# Patient Record
Sex: Female | Born: 1990 | Race: White | Hispanic: No | Marital: Married | Smoking: Current every day smoker
Health system: Southern US, Community
[De-identification: ages and names within clinical notes are randomized; demographics above are authoritative.]

## PROBLEM LIST (undated history)

## (undated) DIAGNOSIS — F319 Bipolar disorder, unspecified: Secondary | ICD-10-CM

## (undated) DIAGNOSIS — J45909 Unspecified asthma, uncomplicated: Secondary | ICD-10-CM

---
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
## 2008-01-07 ENCOUNTER — Emergency Department (HOSPITAL_COMMUNITY): Admission: EM | Admit: 2008-01-07 | Discharge: 2008-01-07 | Payer: Self-pay | Admitting: Emergency Medicine

## 2008-01-23 ENCOUNTER — Emergency Department (HOSPITAL_COMMUNITY): Admission: EM | Admit: 2008-01-23 | Discharge: 2008-01-23 | Payer: Self-pay | Admitting: Emergency Medicine

## 2008-01-24 ENCOUNTER — Ambulatory Visit (HOSPITAL_COMMUNITY): Admission: EM | Admit: 2008-01-24 | Discharge: 2008-01-24 | Payer: Self-pay | Admitting: Emergency Medicine

## 2008-01-24 ENCOUNTER — Encounter: Payer: Self-pay | Admitting: Obstetrics and Gynecology

## 2008-03-02 ENCOUNTER — Emergency Department (HOSPITAL_COMMUNITY): Admission: EM | Admit: 2008-03-02 | Discharge: 2008-03-02 | Payer: Self-pay | Admitting: Emergency Medicine

## 2008-03-16 ENCOUNTER — Emergency Department (HOSPITAL_COMMUNITY): Admission: EM | Admit: 2008-03-16 | Discharge: 2008-03-16 | Payer: Self-pay | Admitting: Emergency Medicine

## 2008-04-04 ENCOUNTER — Inpatient Hospital Stay (HOSPITAL_COMMUNITY): Admission: EM | Admit: 2008-04-04 | Discharge: 2008-04-05 | Payer: Self-pay | Admitting: Emergency Medicine

## 2008-04-21 ENCOUNTER — Emergency Department (HOSPITAL_COMMUNITY): Admission: EM | Admit: 2008-04-21 | Discharge: 2008-04-21 | Payer: Self-pay | Admitting: Emergency Medicine

## 2008-05-26 ENCOUNTER — Emergency Department (HOSPITAL_COMMUNITY): Admission: EM | Admit: 2008-05-26 | Discharge: 2008-05-26 | Payer: Self-pay | Admitting: Emergency Medicine

## 2008-06-19 ENCOUNTER — Ambulatory Visit: Payer: Self-pay | Admitting: Cardiology

## 2008-06-23 ENCOUNTER — Ambulatory Visit: Payer: Self-pay | Admitting: Cardiology

## 2008-06-26 ENCOUNTER — Ambulatory Visit: Payer: Self-pay | Admitting: Internal Medicine

## 2008-06-26 ENCOUNTER — Ambulatory Visit (HOSPITAL_COMMUNITY): Admission: RE | Admit: 2008-06-26 | Discharge: 2008-06-26 | Payer: Self-pay | Admitting: Internal Medicine

## 2008-06-28 ENCOUNTER — Emergency Department (HOSPITAL_COMMUNITY): Admission: EM | Admit: 2008-06-28 | Discharge: 2008-06-28 | Payer: Self-pay | Admitting: Emergency Medicine

## 2008-08-24 ENCOUNTER — Emergency Department (HOSPITAL_COMMUNITY): Admission: EM | Admit: 2008-08-24 | Discharge: 2008-08-24 | Payer: Self-pay | Admitting: Emergency Medicine

## 2008-10-19 ENCOUNTER — Emergency Department (HOSPITAL_COMMUNITY): Admission: EM | Admit: 2008-10-19 | Discharge: 2008-10-19 | Payer: Self-pay | Admitting: Emergency Medicine

## 2009-02-02 ENCOUNTER — Emergency Department (HOSPITAL_COMMUNITY): Admission: EM | Admit: 2009-02-02 | Discharge: 2009-02-02 | Payer: Self-pay | Admitting: Emergency Medicine

## 2009-06-08 IMAGING — CR DG ABDOMEN ACUTE W/ 1V CHEST
3 series · 3 of 3 positions shown · non-contrast
Comparison: Chest radiograph 03/02/2008

CLINICAL DATA: Nausea, vomiting, diarrhea, question food poisoning

ACUTE ABDOMEN SERIES (ABDOMEN 2 VIEW & CHEST 1 VIEW)

[view not recorded (1 of 3)]
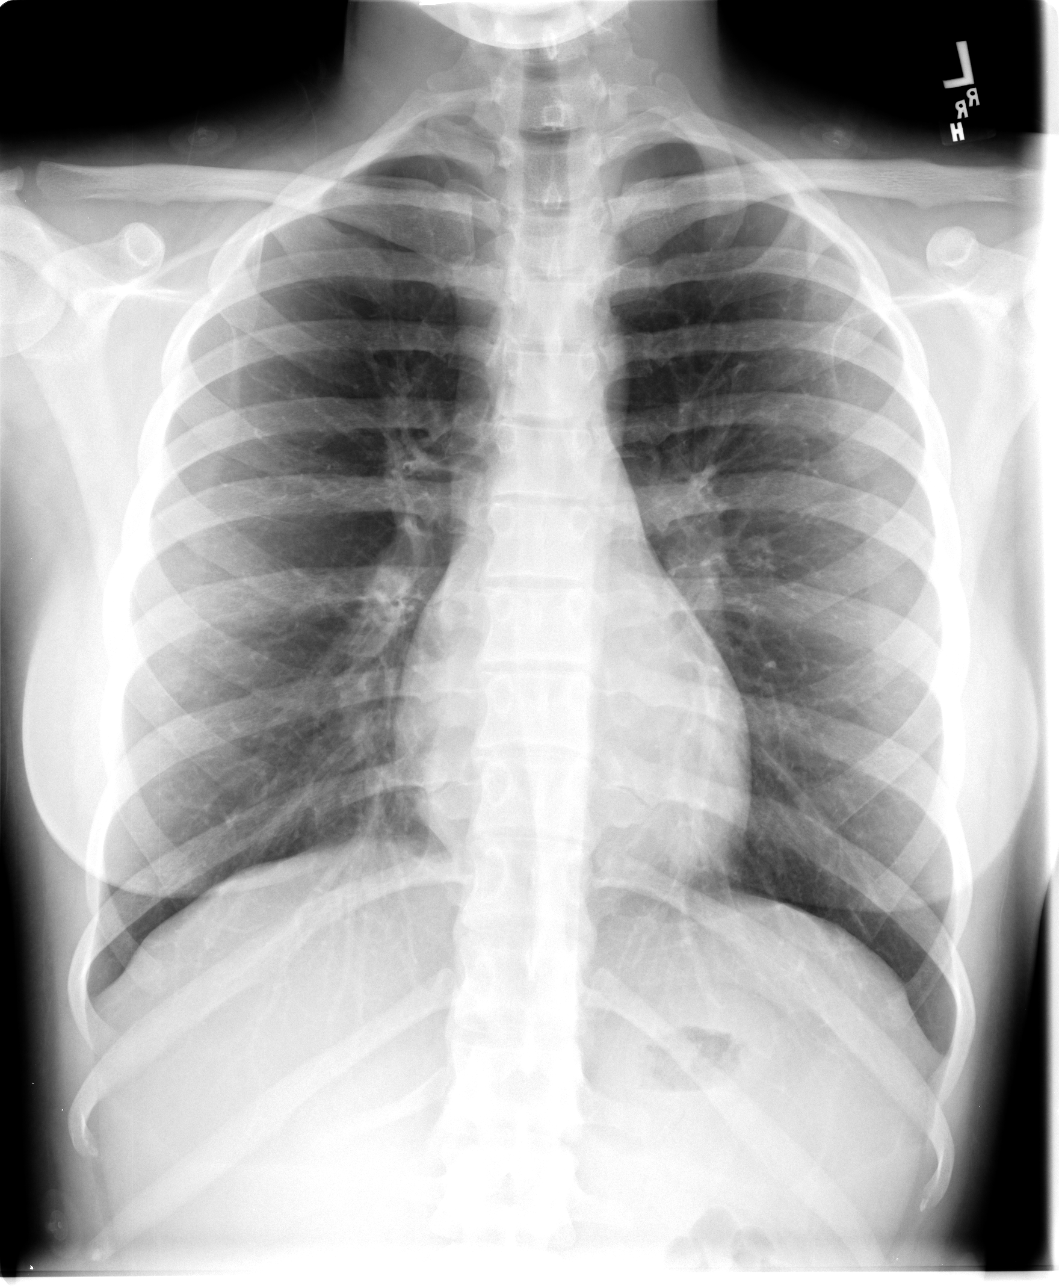

[view not recorded (2 of 3)]
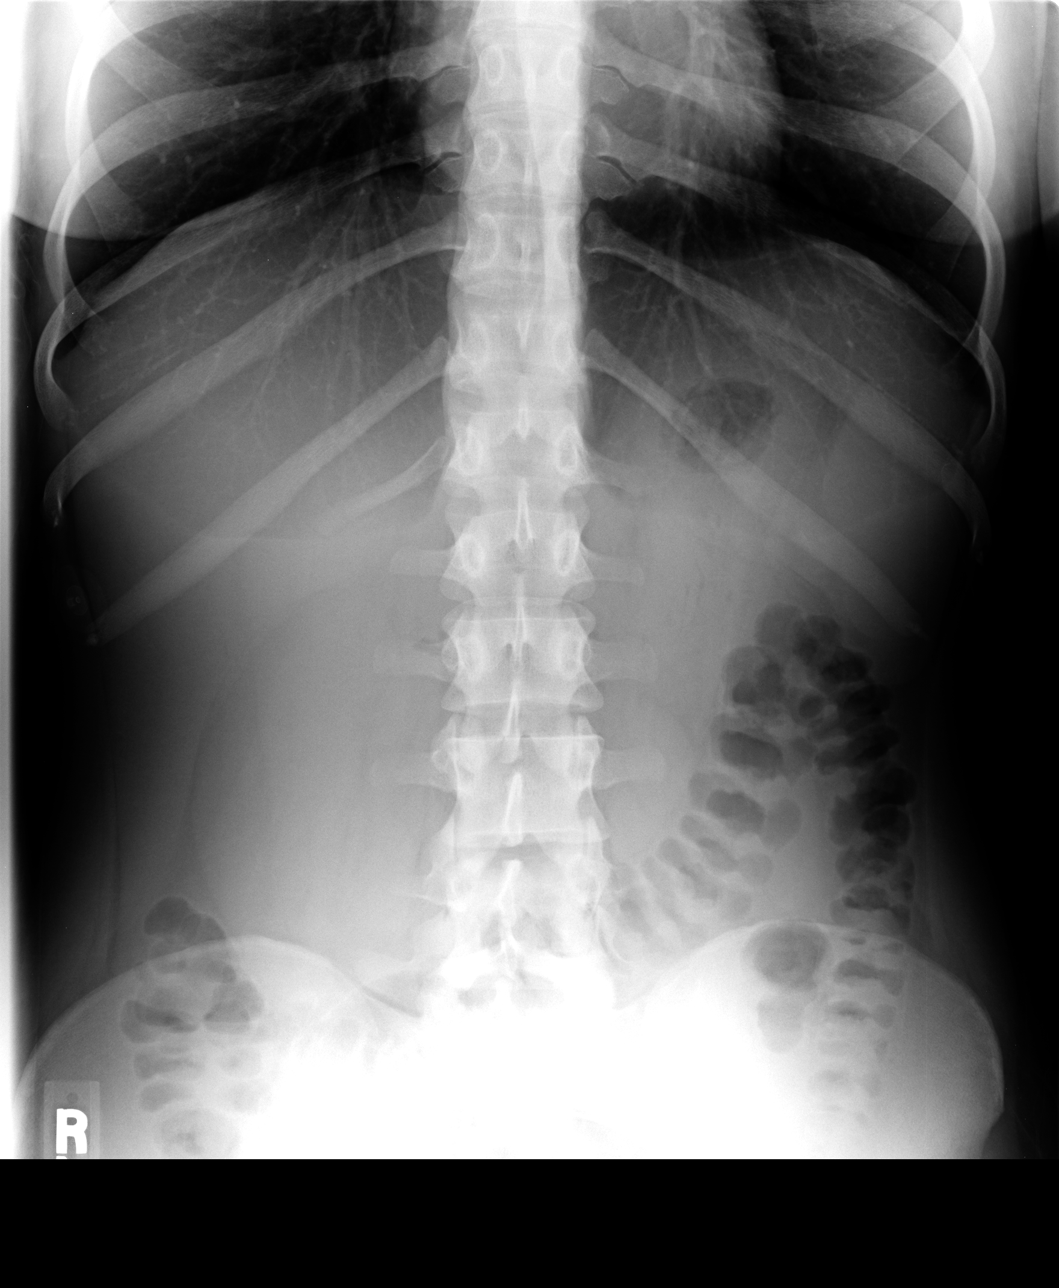

[view not recorded (3 of 3)]
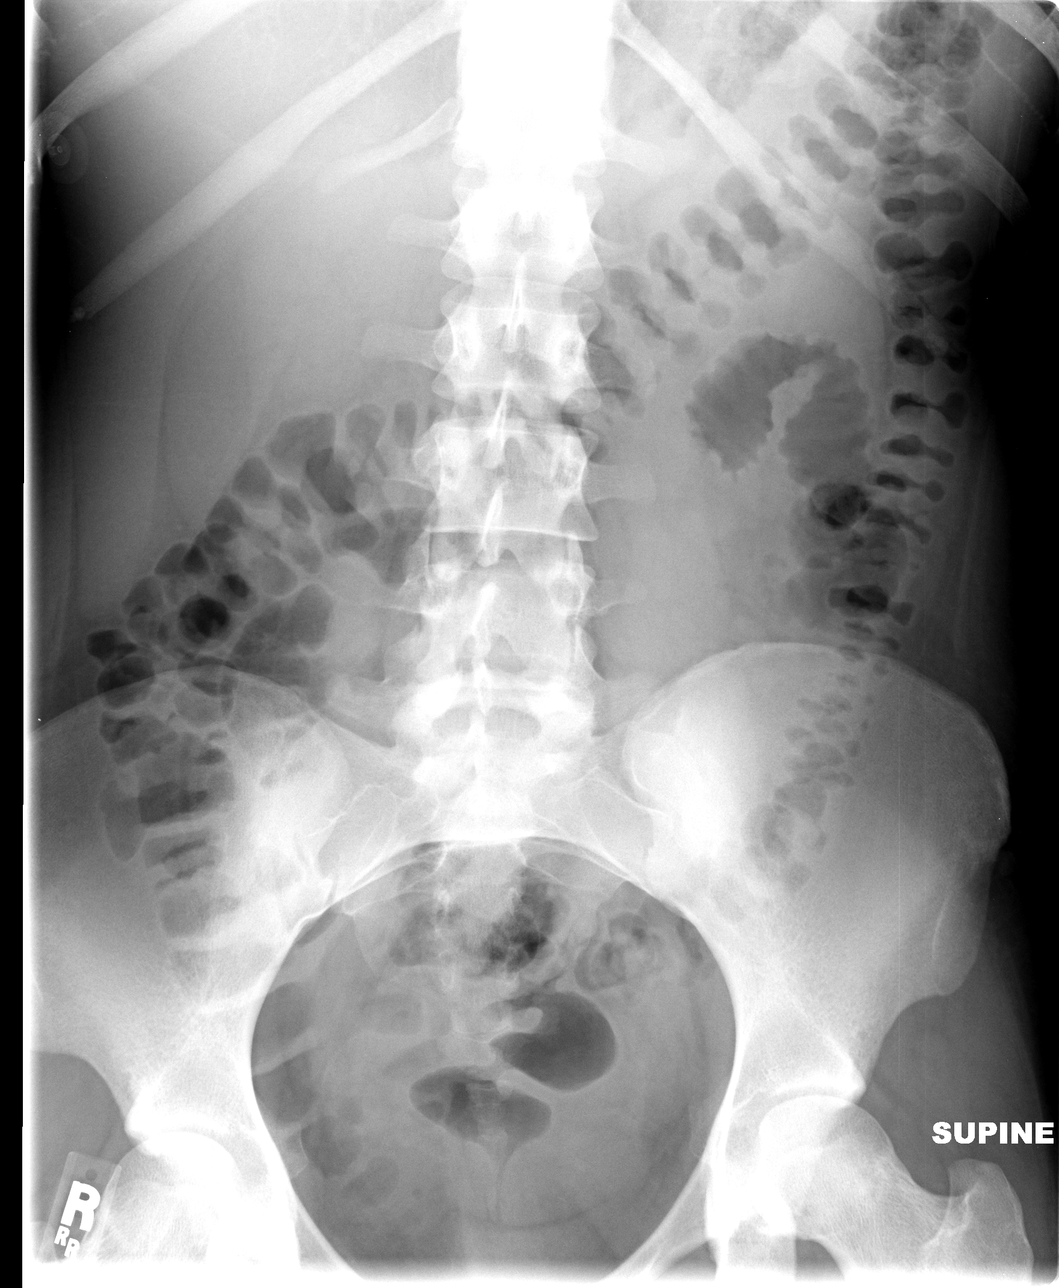

[3 of 3 positions shown; findings below may reference images not displayed]

FINDINGS: Normal heart size, mediastinal contours, and pulmonary vascularity.
Chronic bronchitic changes without infiltrate or effusion.
Normal bowel gas pattern.
No bowel dilatation, obstruction, or free air.
Single nonspecific small bowel loop left mid abdomen.
Bones unremarkable.
No urinary tract calcification.
IMPRESSION: Benign abdominal films.
Chronic bronchitic changes.

## 2009-07-11 ENCOUNTER — Emergency Department (HOSPITAL_COMMUNITY): Admission: EM | Admit: 2009-07-11 | Discharge: 2009-07-12 | Payer: Self-pay | Admitting: Emergency Medicine

## 2009-08-17 ENCOUNTER — Emergency Department (HOSPITAL_COMMUNITY): Admission: EM | Admit: 2009-08-17 | Discharge: 2009-08-17 | Payer: Self-pay | Admitting: Emergency Medicine

## 2010-08-06 LAB — RAPID STREP SCREEN (MED CTR MEBANE ONLY): Streptococcus, Group A Screen (Direct): NEGATIVE

## 2010-09-01 LAB — PREGNANCY, URINE: Preg Test, Ur: NEGATIVE

## 2010-09-02 LAB — STREP A DNA PROBE

## 2010-09-14 IMAGING — CR DG CHEST 2V
2 series · 2 of 2 positions shown · non-contrast
Comparison: 03/02/2008

CLINICAL DATA: Flu symptoms.  Sore throat.  Sinus congestion and
fatigue for 3 days.  History smoking.

CHEST - 2 VIEW

[view not recorded (1 of 2)]
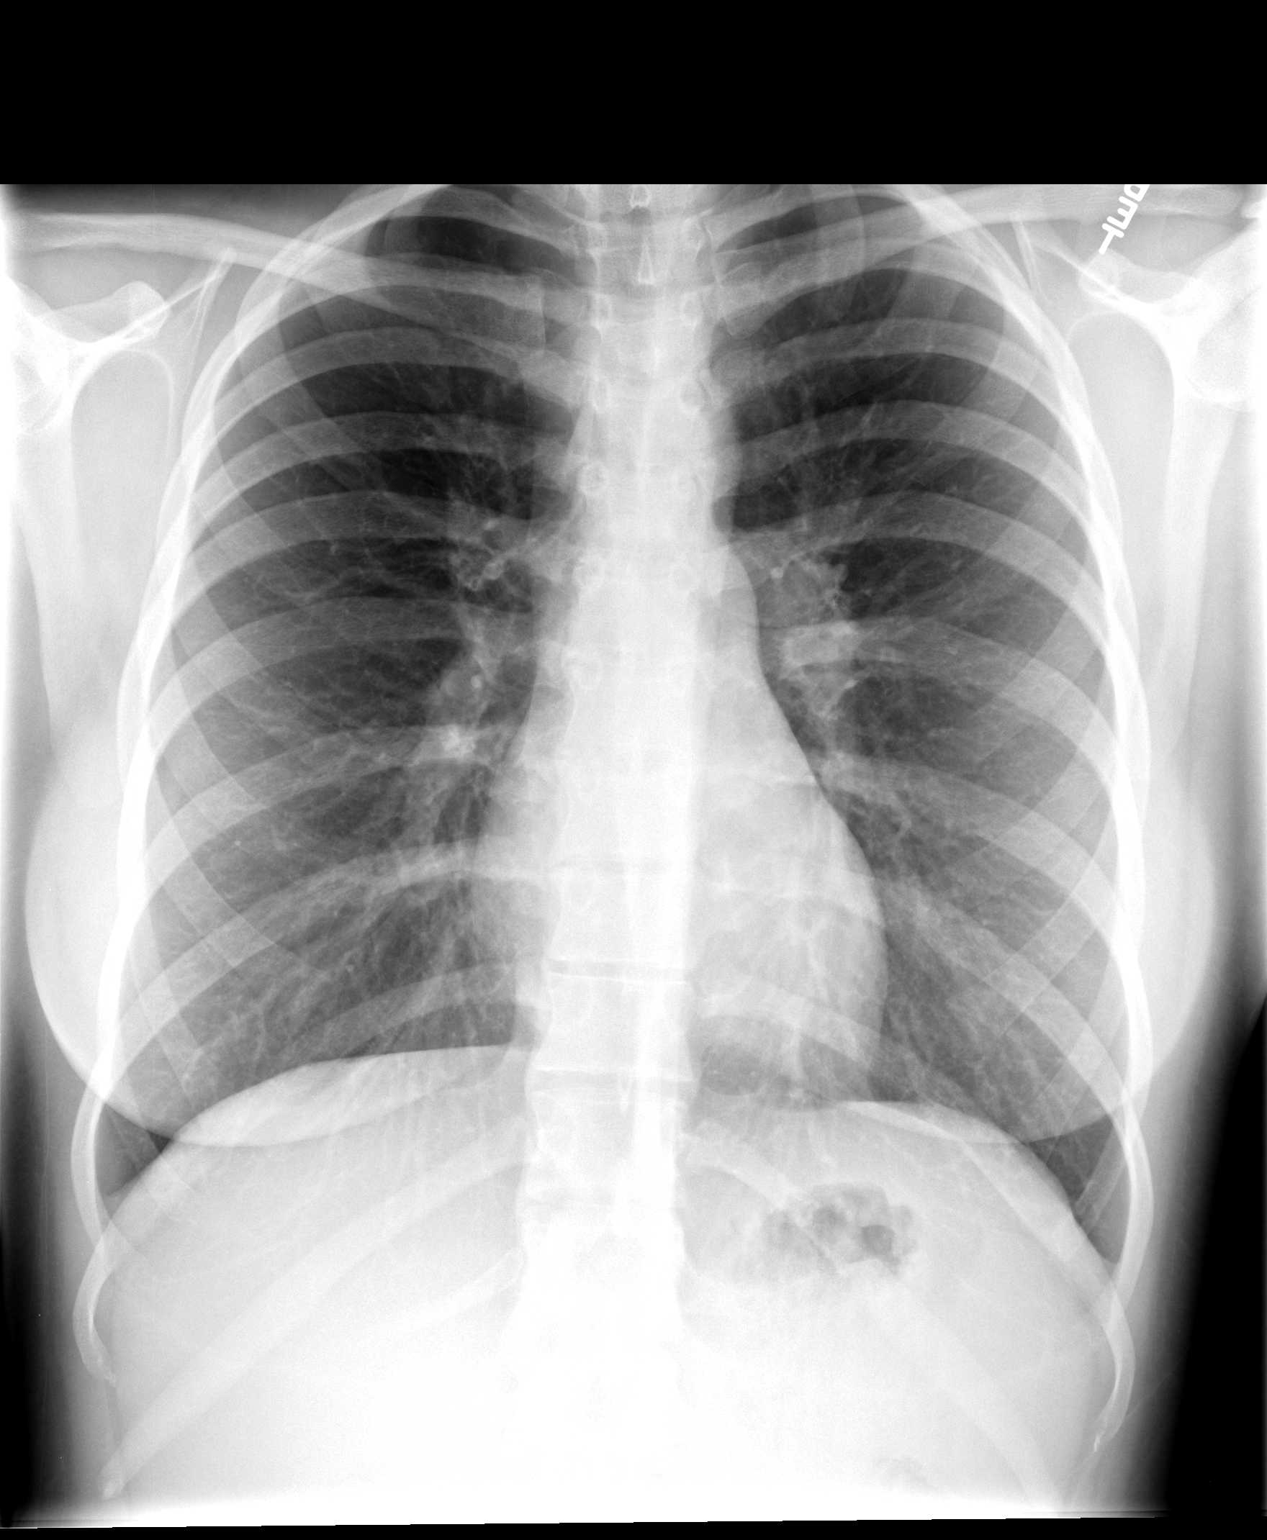

[view not recorded (2 of 2)]
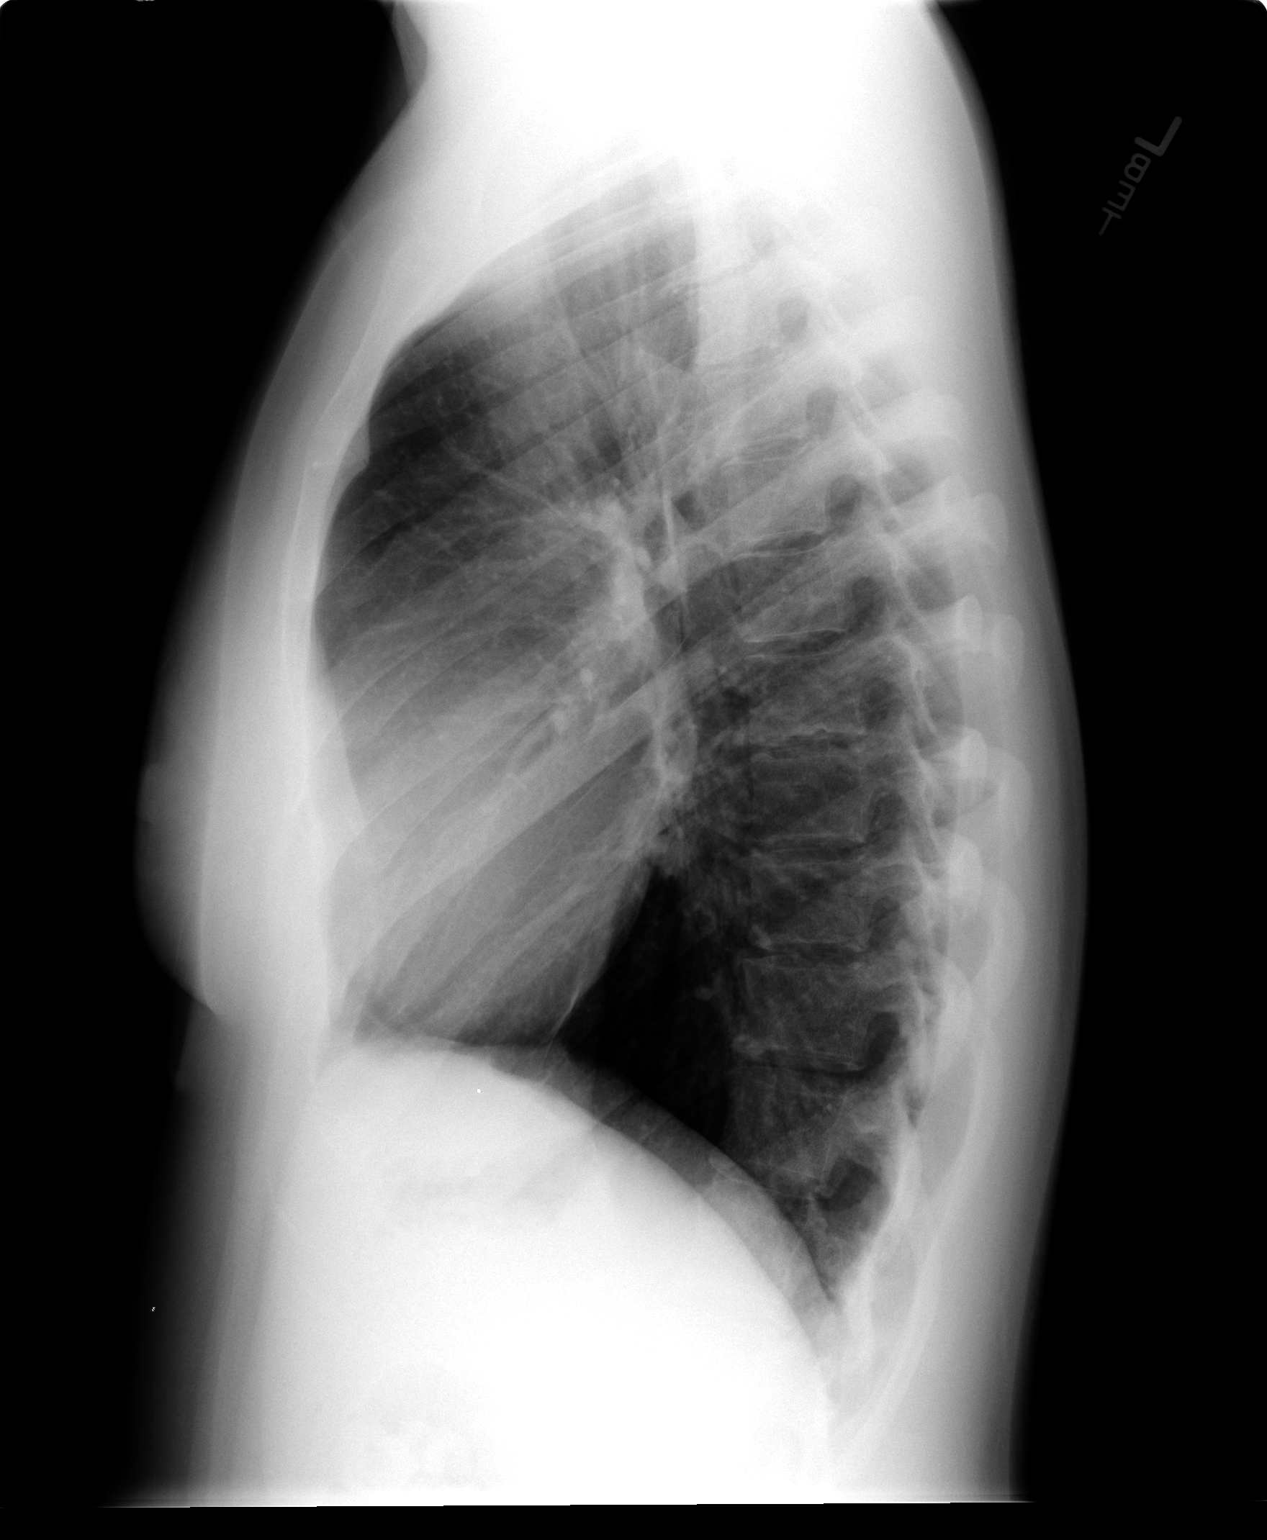

[2 of 2 positions shown; findings below may reference images not displayed]

FINDINGS: Cardiac size is normal.  Lungs are free of focal
consolidations and pleural effusions.  No evidence for pulmonary
edema.
IMPRESSION: No evidence for acute cardiopulmonary abnormality.

## 2010-09-30 NOTE — Op Note (Signed)
NAMECYDNE, GRAHN NO.:  [id number]   MEDICAL RECORD NO.:  [id number]          PATIENT TYPE:  OIB   LOCATION:  2899                         FACILITY:  [facility]   PHYSICIAN:  Doylene Canning. Ladona Ridgel, MD    DATE OF BIRTH:  03-05-1991   DATE OF PROCEDURE:  06/26/2008  DATE OF DISCHARGE:  06/26/2008                               OPERATIVE REPORT   PROCEDURE PERFORMED:  Head-up tilt table test.   INDICATIONS:  Unexplained recurrent syncope.   INTRODUCTION:  The patient is a 20 year old woman who has had a history  of recurrent episodes of dizziness and lightheadedness as well as frank  syncope.  She was seen by Dr. Dietrich Pates who referred her for today a tilt  table testing to better characterize the etiology of her syncope.   PROCEDURE:  After informed consent was obtained, the patient was taken  to the diagnostic EP Lab in a fasting state.  After usual preparation,  she was placed in the supine position and her initial blood pressure was  in the 110/60 range with a heart rate in the 60 range.  She was  subsequently placed in the head-up tilt table position after a 10-minute  equilibration and her heart rate which was initially in the 60-70 range  went up into the 90s fairly precipitously within several minutes.  The  blood pressure remained stable initially.  At approximately 13 minutes  into tilting, the heart rate increased into the 115 range.  The  patient's heart rate then dropped down into the 30s.  During this time,  her blood pressure went from the 90s down to being unrecordable and the  patient actually passed out briefly.  She was placed immediately back in  the supine position where her initial blood pressure was measured in the  60s and then subsequent blood pressures increased into the 80s-90s and  then finally into the 100 range.  She was returned to her room in  satisfactory condition.   COMPLICATIONS:  There were no immediate procedure complications.   RESULTS:  This demonstrates a positive head-up tilt table test for  reproducible syncope.  The results will be referred to Dr. Dietrich Pates who  is the patient's primary cardiologist and to Dr. Wayland Salinas.      Doylene Canning. Ladona Ridgel, MD  Electronically Signed     GWT/MEDQ  D:  06/26/2008  T:  06/26/2008  Job:  [id number]

## 2010-09-30 NOTE — H&P (Signed)
Cheryl Hood, Cheryl Hood                ACCOUNT NO.:  [id number]   MEDICAL RECORD NO.:  [id number]          PATIENT TYPE:  INP   LOCATION:  A307                          FACILITY:  [facility]   PHYSICIAN:  Lonia Blood, M.D.      DATE OF BIRTH:  1991/03/07   DATE OF ADMISSION:  04/04/2008  DATE OF DISCHARGE:  LH                              HISTORY & PHYSICAL   PRIMARY CARE PHYSICIAN:  The patient is unassigned.   PRESENTING COMPLAINT:  Abdominal pain, nausea and vomiting.   HISTORY OF PRESENT ILLNESS:  The patient is a 20 year old female with  past history of asthma, apparently chronic back pain and rheumatoid  arthritis who has been doing okay, not on any major treatment.  She came  in secondary to experiencing some back pain mainly in the flanks and  intractable nausea and vomiting for 3 days.  The patient has tried to  get it treated at home but symptoms have persisted.  She has had some  fever at home but not in the ER.  She was, however, unable to keep  anything down and feeling dry.   PAST MEDICAL HISTORY:  Significant for asthma, back pain, rheumatoid  arthritis.   ALLERGIES:  She has no known drug allergies.   MEDICATIONS:  Only albuterol p.r.n.   SOCIAL HISTORY:  The patient is a single mother of two kids.  She just  moved to [place] 2 years ago.  Smokes about one half to one pack per  day.  Denied any IV drug use and no alcohol abuse.   FAMILY HISTORY:  The patient is estranged from her family who lives in  [place].  She grew up in [place].  No significant family  history as far as she knows.   REVIEW OF SYSTEMS:  A 12 point review of systems is negative except per  HPI.  The patient denied any hematochezia and hematemesis.  No melena.   PHYSICAL EXAMINATION:  VITAL SIGNS:  Temperature was 98.3, blood  pressure 104/70, pulse 114, respiratory rate 20, sats 97% on room air.  GENERAL:  The patient was acutely ill-looking in no acute distress.  HEENT:  PERRL.   EOMI.  NECK:  Neck is supple.  No JVD, no lymphadenopathy.  RESPIRATORY:  She has good air entry bilaterally.  No wheezes or rales.  CARDIOVASCULAR:  Shows S1-S2.  No murmur.  ABDOMEN:  Abdomen was soft, nontender with positive bowel sounds.  Mild  CV angle tenderness was noted.  EXTREMITIES:  No edema, cyanosis or clubbing.   LABS:  White count 9.3, hemoglobin 14.8, platelet count 201 with normal  differential.  Sodium 140, potassium 3.6, chloride 107, CO2 27, glucose  91, BUN 90, creatinine 0.71, calcium 9.0 and normal LFTs.  Lipase was  18.  Urine pregnancy test was negative.  Urinalysis showed cloudy urine  with small hemoglobin, small bilirubin, trace ketones, trace proteins.  Nitrite was positive.  Leukocyte esterase moderately positive.  WBCs 21-  50 and many bacteria.  Acute abdominal series showed benign abdominal  findings and chronic bronchitic changes.  ASSESSMENT:  Therefore, this is a 20 year old female presenting with  what appears to be acute pyelonephritis and concomitant vomiting.  The  patient has done well but she is unable to keep anything down.  Plan  therefore will be:   PLAN:  1. Intractable nausea and vomiting.  We will admit the patient and      keep her initially n.p.o. and as she improves we will advance her      diet.  Will give her some Phenergan and Zofran for symptom control      and treat her underlying condition which is a pyelonephritis.  2. Acute pyelonephritis.  Will give the patient IV Cipro until she is      able to start taking p.o.'s and then we will convert her medication      to p.o.  3. History of asthma.  The patient is not wheezing.  We will continue      with p.r.n. albuterol in the hospital.  4. Chronic back pain.  Will avoid pain medication at this point.  The      patient we will resume her home therapy after she leaves the      hospital.  Otherwise, the main core of treatment will be her acute      pyelonephritis.      Lonia Blood, M.D.  Electronically Signed     LG/MEDQ  D:  04/05/2008  T:  04/05/2008  Job:  [id number]

## 2010-09-30 NOTE — Op Note (Signed)
Cheryl Hood, Cheryl Hood                ACCOUNT NO.:  [id number]   MEDICAL RECORD NO.:  [id number]          PATIENT TYPE:  AMB   LOCATION:  DAY                           FACILITY:  [facility]   PHYSICIAN:  Tilda Burrow, M.D. DATE OF BIRTH:  1991/03/05   DATE OF PROCEDURE:  01/24/2008  DATE OF DISCHARGE:                               OPERATIVE REPORT   PREOPERATIVE DIAGNOSIS:  Missed/incomplete abortion.   POSTOPERATIVE DIAGNOSIS:  Missed/incomplete abortion.   PROCEDURE:  Suction, dilation, and curettage.   SURGEON:  Tilda Burrow, MD   ASSISTANT:  None.   ANESTHESIA:  Paracervical block with IV sedation (MAC).   COMPLICATIONS:  None.   FINDINGS:  Uterine products of conception consistent with missed AB,  cervix dilated prior to procedure, not requiring dilation, blood type O  positive.   DETAILS OF PROCEDURE:  The patient was taken to operating room, prepped  and draped in the usual fashion for vaginal procedure with cervix  grasped with single-tooth tenaculum after paracervical block applied and  vaginal prepping performed.  Time-out was conducted prior to procedure.  Uterus was sounded to 9 cm, easily allowed introduction of a curved 9-mm  suction curette and circumferential curettage obtained satisfactory  blood clots and tissue fragments consistent with missed AB.  Smooth  sharp curettage was performed within the uterine fundus confirming  satisfactory uterine evacuation.  There was small bit of oozing at the  end of procedure from where the cervical grasping with the tenaculum had  been placed.  The patient tolerated procedure well and went to recovery  room in good condition.  Sponge and needle counts correct.      Tilda Burrow, M.D.  Electronically Signed     JVF/MEDQ  D:  01/24/2008  T:  01/25/2008  Job:  [id number]   cc:   [facility]

## 2010-09-30 NOTE — Letter (Signed)
June 19, 2008    Wayland Salinas, MD  [address] [place] [address]   RE:  Cheryl, Hood  MRN:  [id number]  /  DOB:  July 23, 1990   Dear Jonny Ruiz,   It was my pleasure evaluating Cheryl Hood in the office today in  consultation at your request for syncope.  As you know, this nice young  woman first experienced loss of consciousness approximately 10 years  ago, at age 20.  She has had multiple spells since including the event  that brought her to the emergency department approximately 3 weeks ago.  She has never had a serious injury.  She did suffer head trauma before  seeing you with a minor scalp laceration.  She describes premonitory  symptoms of nausea, weakness, diaphoresis and dizziness.  If she lies  down, she can terminate episodes, although she has progressed to  complete loss of consciousness even after assuming a supine position.  She also has lost consciousness from a sitting position.  No tonic  clonic activity has ever been noted.  She only had one episode of fecal  incontinence.  She does not have orthostatic symptoms.  The duration of  loss of consciousness is variable, but has been up 2 hours.  She has  been told that she is confused when she first awakens.   Otherwise, past medical history is benign.  She has never been  hospitalized.  She has never undergone any surgery.  She has not  previously been seen by a cardiologist nor undergone any cardiac  testing.  In fact, she has never informed any physician about her  syncopal spells and has never been worked up.   She has no known drug allergies.  She takes no medications routinely.   SOCIAL HISTORY:  Employed at a [employer]; single, but  has a steady boyfriend and two children, one 65 years of age and one  nearly 22 years of age.  She is active and eats healthy diet, but does  not have much salt intake.   FAMILY HISTORY:  Negative among first-degree relatives.  She has  grandparents who died due  to neoplastic disease.   REVIEW OF SYSTEMS:  Cheryl Hood method of contraception is a  progesterone implant.  She occasionally experiences minor palpitations.  She is active without problems.  She was told as a child that she has  rheumatoid arthritis.  She continues to have some discomfort in her  hands intermittently.  She has also been told of asthma in the past, but  does not sound as if she really has this entity.  She has an inhaler  that was prescribed for her years ago, but rarely uses it.  All other  systems reviewed and are negative.   PHYSICAL EXAMINATION:  GENERAL:  Pleasant, trim, young woman in no acute  distress.  VITAL SIGNS:  The weight is 146.  Blood pressure 120/80 without  orthostatic change, heart rate 85 and regular, respirations 14,  nonlabored.  HEENT:  Anicteric sclerae; normal oral mucosa; normal EOMs.  NECK:  No jugular venous distention; no carotid bruits.  LUNGS:  Clear.  CARDIAC:  Normal first and second heart sounds.  ABDOMEN:  Soft and nontender; no organomegaly.  EXTREMITIES:  No edema; normal distal pulses.  ENDOCRINE:  Minimal thyromegaly.  HEMATOPOIETIC:  No adenopathy.  SKIN:  No significant lesions.   EKG:  Normal sinus rhythm; right ventricular conduction delay; otherwise  unremarkable.  IMPRESSION:  Cheryl Hood describes a history that is highly consistent  with neurocardiogenic syncope.  The only area of concern, but a  significant one, is her prolonged episodes of loss of consciousness.  Neurocardiogenic spells should generally be brief.  Because of the  diagnostic uncertainty, a tilt table test will be performed.  Basic  laboratories including a CBC, metabolic  profile, TSH, and urinalysis are pending.  Although, I think an  arrhythmia is highly unlikely, we will provide her with an event  recorder for the next 3 weeks.  When she has had her tilt table test,  she will start increased salt intake and use of isometric exercises when   she experiences premonitory symptoms.  I will reassess this nice woman  in 1 month.    Sincerely,      Gerrit Friends. Dietrich Pates, MD, [facility]  Electronically Signed    RMR/MedQ  DD: 06/19/2008  DT: 06/20/2008  Job #: [id number]

## 2010-09-30 NOTE — Discharge Summary (Signed)
Cheryl Hood, Cheryl Hood NO.:  [id number]   MEDICAL RECORD NO.:  [id number]          PATIENT TYPE:  OIB   LOCATION:  2899                         FACILITY:  [facility]   PHYSICIAN:  Doylene Canning. Ladona Ridgel, MD    DATE OF BIRTH:  05/31/1990   DATE OF ADMISSION:  06/26/2008  DATE OF DISCHARGE:  06/26/2008                               DISCHARGE SUMMARY   Time for this dictation, greater than 35 minutes.   DRUG ALLERGIES:  No known drug allergies.   FINAL DIAGNOSES:  1. Syncope of unclear etiology.  2. Head-up tilt study positive on admission to [facility],      June 26, 2008.   BRIEF HISTORY:  Cheryl Hood is a 20 year old female.  She has had  episodes of syncope since she was age 22.  The patient says that they  become more frequent when she is pregnant.  She says that she has them  mainly when she is standing up, not when sitting or lying down.   The patient had an episode at work on May 26, 2008.  She had  premonitory symptoms of flushing, nausea, lightheadedness, tunnel  vision, weakness, and diaphoresis.   The patient awoke actually in the emergency room and apparently was not  aware of all the details involved in transportation for her.  This  episode after all the others has triggered a followup with [facility].  Dr. Dietrich Pates has ordered a monitor and taken blood studies, which  included a CBC, a BMET, pregnancy test.  At this time, the patient has  worn the monitor for about 3 days.   The patient mentions that she does have episodes of palpitations when  she is under stress or has some anxiety, and she has some of the same  symptoms that she has prior to passing out with these palpitations.  They have lasted as long as 2 hours in the past.  The patient has had  apparently some episodes of palpitations using the monitor but has not  yet called them in.   The patient is arranged for a tilt study on June 26, 2008, at [facility].   PROCEDURE:  On June 26, 2008, head-up tilt study in the  electrophysiology lab.  The patient's initial heart rate was 96, blood  pressure 110/69.  The patient was raised to a standing position and then  lowered slowly back to 70 degrees.  After 9 minutes, the patient's heart  rate had decreased to 64 beats per minute with a blood pressure of  98/56.  At that time, she said she was feeling of nauseated, weak, and  sweaty.  Two minutes later, heart rate had decreased further to 48-41  beats a minute, and the patient passed out briefly.  Her blood pressure  actually at the lowest point during the study was 60/33.  The patient  was laid back supine and over 2-minute period, her heart rate increased  to 49 beats per minute and blood pressure 105/55.  The patient is not  aware of the traverse  of the table from the 70-degree position to the  supine position secondary to being passed out.  The patient describes  her experience on the tilt table was actually even more terrifying, if  the word could be used, and when she is unfettered, she says her body  was struggling under the symptoms to break loose somehow and so there  was a dialectic going on between her warning to pass out and her body  fighting to stay awake.  Apparently, she does not have quite these  symptoms when she is able to either lie down quickly or when she just  simply is standing and passes out without any prolongation of the  symptoms.  The patient currently is taking no medications.  She will  work to include more salt and electrolytes in her diet and take an  increase in fluid during the day.  She has an appointment with Dr.  Dietrich Pates at [facility] on Wednesday, July 25, 2008, at  11:30.   LABORATORY STUDIES:  Pertinent to this admission were drawn on June 19, 2008.  White cells are 11.8, hemoglobin 15.6, hematocrit 43.7,  platelets are 219.  Sodium 144, potassium 4.5, chloride 105,  carbonate  21, glucose 83, BUN is 12, creatinine 0.64.  If there are any other  changes, Dr. Ladona Ridgel will be speaking to the patient briefly prior to her  discharge.  On questioning by her significant other, the patient may  drive since she has never had one of these episodes while sitting down.      Maple Mirza, PA      Doylene Canning. Ladona Ridgel, MD  Electronically Signed    GM/MEDQ  D:  06/26/2008  T:  06/27/2008  Job:  [id number]   cc:   Gerrit Friends. Dietrich Pates, MD, [facility]

## 2010-10-03 NOTE — Op Note (Signed)
NAME:  [name], NORTH NO.:  [id number]   MEDICAL RECORD NO.:  [id number]          PATIENT TYPE:  INP   LOCATION:  A413                          FACILITY:  [facility]   PHYSICIAN:  Lazaro Arms, M.D.   DATE OF BIRTH:  Dec 29, 1990   DATE OF PROCEDURE:  08/04/2006  DATE OF DISCHARGE:                               OPERATIVE REPORT   Cheryl Hood is 20 year old white female gravida 2, para 1, in active phase of  labor 4 cm dilated, requesting epidural be placed.   She is placed in sitting position.  Betadine prep was used.  1%  lidocaine is injected in the L3-L4 interspace.  The area is field  draped.  17 gauge Tuohy needle is used.  Loss of resistance technique  was employed and the epidural space was found with one pass without  difficulty.  10 mL of 0.125% bupivacaine plain was given as a test dose.  The epidural catheter was then fed.  An additional 10 mL was given.  The  continuous infusion of 0.125% bupivacaine with 2 mcg per mL of fentanyl  was begun at 12 mL an hour.  The patient tolerated the procedure well.  She is getting good pain relief.  Fetal heart tracing stable.      Lazaro Arms, M.D.  Electronically Signed     LHE/MEDQ  D:  08/04/2006  T:  08/05/2006  Job:  [id number]

## 2010-10-03 NOTE — H&P (Signed)
Cheryl Hood, SLIWA NO.:  [id number]   MEDICAL RECORD NO.:  [id number]          PATIENT TYPE:  INP   LOCATION:  LDR1                          FACILITY:  [facility]   PHYSICIAN:  Tilda Burrow, M.D. DATE OF BIRTH:  1991/02/11   DATE OF ADMISSION:  08/04/2006  DATE OF DISCHARGE:  LH                              HISTORY & PHYSICAL   REASON FOR ADMISSION:  Pregnancy at 39 weeks in early active labor.   PAST MEDICAL HISTORY:  1. Asthma.  2. Rheumatoid arthritis.   PAST SURGICAL HISTORY:  Broken collarbone.   ALLERGIES:  She has no known allergies.   MEDICATIONS:  She is on prenatal vitamins and Albuterol inhaler and  nebulizer.   PRENATAL COURSE:  Essentially uneventful.  She did have late entry  prenatal care.  Blood type is O-positive, UDS is negative, Rubella is  immune, hepatitis B surface antigen is negative, HIV is negative,  hemoglobin 12.5, hematocrit 38, GC and Chlamydia were negative, GBS was  positive.  The patient is getting ampicillin 2 gm IV and then 1 gm q.4.   PHYSICAL EXAMINATION:  Vital signs are stable.  Fetal heart rate pattern  is stable.  Phase accelerations are noted.  The cervix on my exam is 7  cm.  AROM was performed, and there was very light-tinged meconium-  stained fluid.  Scalp electrode was applied.  Contractions are q.2.5 to  3 minutes apart and strong in intensity.      Zerita Boers, [name]      Tilda Burrow, M.D.  Electronically Signed    DL/MEDQ  D:  54/01/8118  T:  08/04/2006  Job:  [id number]   cc:   Douglass Rivers, M.D.  Fax: [phone]

## 2010-10-03 NOTE — Op Note (Signed)
NAMEKENNETHIA, LYNES NO.:  [id number]   MEDICAL RECORD NO.:  [id number]          PATIENT TYPE:  INP   LOCATION:  LDR1                          FACILITY:  [facility]   PHYSICIAN:  Tilda Burrow, M.D. DATE OF BIRTH:  [birth year]   DATE OF PROCEDURE:  08/04/2006  DATE OF DISCHARGE:                               OPERATIVE REPORT   DELIVERY NOTE   ONSET OF LABOR:  August 04, 2006.   DATE OF DELIVERY:  August 04, 2006, at 1802 p.m.   LENGTH OF FIRST STAGE LABOR:  Five hours and 55 minutes.   LENGTH OF SECOND STAGE LABOR:  Seven minutes.   LENGTH OF THIRD STAGE LABOR:  Eight minutes.   DELIVERY NOTE:  Cabella had a normal spontaneous vaginal delivery of a  viable female infant.  Upon delivery of head, there was light meconium  so nose and mouth were thoroughly suctioned with the DeLee mucus trap  with very little clear mucus obtained.  Upon delivery of head, there was  spontaneous rotation of shoulders and spontaneous delivery of infant  without complication.  Apgar's were 9 and 9.  Third stage of labor was  actively managed with 20 units Pitocin in 1000 ml of D5 LR to rapid  rate.  Placenta was delivered spontaneously via Tomasa Blase mechanism.  Cord  blood and cord blood gas were obtained.  Three-vessel cord is noted upon  inspection.  Membranes were noted to be intact upon inspection.  Placenta was sent to Pathology for evaluation due to meconium staining.  The perineum is noted to be intact.  Estimated blood loss approximately  350 ml.  Epidural catheter was removed with blue tip intact.  The infant  and mother stabilized and transferred out to the postpartum unit.      Zerita Boers, [name]      Tilda Burrow, M.D.  Electronically Signed   DL/MEDQ  D:  84/69/6295  T:  08/04/2006  Job:  [id number]   cc:   Douglass Rivers, M.D.  Fax: [phone]

## 2011-02-17 LAB — CBC
HCT: 36.3
HCT: 43.2
Hemoglobin: 12.4
Hemoglobin: 14.8
MCHC: 34
MCHC: 34.3
MCV: 88
MCV: 88.4
RBC: 4.11
RBC: 4.91

## 2011-02-17 LAB — URINE MICROSCOPIC-ADD ON

## 2011-02-17 LAB — URINALYSIS, ROUTINE W REFLEX MICROSCOPIC
Glucose, UA: NEGATIVE
pH: 5.5

## 2011-02-17 LAB — BASIC METABOLIC PANEL
BUN: 6
Creatinine, Ser: 0.76
GFR calc non Af Amer: >60
Potassium: 3.3 — ABNORMAL LOW

## 2011-02-17 LAB — URINE CULTURE

## 2011-02-17 LAB — COMPREHENSIVE METABOLIC PANEL
ALT: 11
BUN: 9
CO2: 27
Calcium: 9
Creatinine, Ser: 0.71
GFR calc non Af Amer: >60
Glucose, Bld: 91

## 2011-02-17 LAB — DIFFERENTIAL
Basophils Relative: 0
Eosinophils Absolute: 0.2
Eosinophils Absolute: 0.2
Lymphocytes Relative: 19
Lymphs Abs: 1.8
Monocytes Absolute: 0.4
Monocytes Relative: 8
Neutro Abs: 6.8
Neutrophils Relative %: 73

## 2011-02-17 LAB — PREGNANCY, URINE: Preg Test, Ur: NEGATIVE

## 2011-02-17 LAB — LIPASE, BLOOD: Lipase: 18

## 2011-02-18 LAB — CBC
Hemoglobin: 13.2
RBC: 4.27
RDW: 12.9

## 2011-02-18 LAB — GLUCOSE, CAPILLARY

## 2011-02-18 LAB — DIFFERENTIAL
Basophils Absolute: 0
Lymphocytes Relative: 18
Monocytes Absolute: 0.7
Neutro Abs: 9.7 — ABNORMAL HIGH

## 2011-02-18 LAB — HEMOGLOBIN AND HEMATOCRIT, BLOOD
HCT: 39.7
Hemoglobin: 13.6

## 2011-02-20 LAB — PREGNANCY, URINE: Preg Test, Ur: NEGATIVE

## 2011-02-20 LAB — URINALYSIS, ROUTINE W REFLEX MICROSCOPIC
Glucose, UA: NEGATIVE mg/dL
Leukocytes, UA: NEGATIVE
Nitrite: NEGATIVE
Protein, ur: NEGATIVE mg/dL
Urobilinogen, UA: 0.2 mg/dL (ref 0.0–1.0)

## 2011-02-20 LAB — URINE MICROSCOPIC-ADD ON

## 2011-02-24 LAB — BASIC METABOLIC PANEL
BUN: 8
CO2: 22
Chloride: 106
Creatinine, Ser: 0.65
Glucose, Bld: 115 — ABNORMAL HIGH

## 2013-09-01 DIAGNOSIS — R55 Syncope and collapse: Secondary | ICD-10-CM | POA: Insufficient documentation

## 2013-09-01 DIAGNOSIS — F319 Bipolar disorder, unspecified: Secondary | ICD-10-CM | POA: Insufficient documentation

## 2019-10-05 DIAGNOSIS — K8020 Calculus of gallbladder without cholecystitis without obstruction: Secondary | ICD-10-CM | POA: Insufficient documentation

## 2020-04-02 ENCOUNTER — Ambulatory Visit (INDEPENDENT_AMBULATORY_CARE_PROVIDER_SITE_OTHER): Payer: 59

## 2020-04-02 ENCOUNTER — Ambulatory Visit
Admission: EM | Admit: 2020-04-02 | Discharge: 2020-04-02 | Disposition: A | Discharge status: Home / Self Care | Payer: 59

## 2020-04-02 DIAGNOSIS — J069 Acute upper respiratory infection, unspecified: Secondary | ICD-10-CM

## 2020-04-02 DIAGNOSIS — R0602 Shortness of breath: Secondary | ICD-10-CM | POA: Diagnosis not present

## 2020-04-02 DIAGNOSIS — R062 Wheezing: Secondary | ICD-10-CM

## 2020-04-02 DIAGNOSIS — R059 Cough, unspecified: Secondary | ICD-10-CM

## 2020-04-02 HISTORY — DX: Unspecified asthma, uncomplicated: J45.909

## 2020-04-02 HISTORY — DX: Bipolar disorder, unspecified: F31.9

## 2020-04-02 MED ORDER — METHYLPREDNISOLONE 4 MG PO TBPK: ORAL_TABLET | ORAL | 0 refills | Status: DC

## 2020-04-02 MED ORDER — ALBUTEROL SULFATE HFA 108 (90 BASE) MCG/ACT IN AERS
2.0000 | INHALATION_SPRAY | Freq: Once | RESPIRATORY_TRACT | Status: AC
  Administered 2020-04-02: 2 via RESPIRATORY_TRACT

## 2020-04-02 MED ORDER — ALBUTEROL SULFATE HFA 108 (90 BASE) MCG/ACT IN AERS: 2.0000 | INHALATION_SPRAY | RESPIRATORY_TRACT | 0 refills | Status: AC | PRN

## 2020-04-02 NOTE — Discharge Instructions (Signed)
Use the inhaler every 4 hour for 5-7 days as needed for cough and wheezing, but on a regular schedule for 3 days  You may use any over the counter cold meds if you need to.  Come back or see your PCP if you get worse or develop a fever

## 2020-04-02 NOTE — ED Provider Notes (Signed)
[facility]    CSN: [id number] Arrival date & time: 04/02/20  0917      History   Chief Complaint Chief Complaint  Patient presents with   Shortness of Breath    HPI Cheryl Hood is a 29 y.o. female who started with chest congestion since yesterday. 3 days ago and PND. She had trouble breathing from her chest last night. Has been coughing foamy mucous of white color.  Denies fever, chills or sweats. Has not been bothered with asthma as adult. Gets occasional wheezing with dust exposure. Has been wheezing and been using her old inhaler which is not helping.  Had covid 9/'21    Past Medical History:  Diagnosis Date   Asthma    Bipolar 1 disorder (HCC)     There are no problems to display for this patient.   History reviewed. No pertinent surgical history.  OB History   No obstetric history on file.      Home Medications    Prior to Admission medications   Medication Sig Start Date End Date Taking? Authorizing Provider  iloperidone (FANAPT) 4 MG TABS tablet Take 4 mg by mouth 2 (two) times daily.   Yes [provider]  albuterol (VENTOLIN HFA) 108 (90 Base) MCG/ACT inhaler Inhale 2 puffs into the lungs every 4 (four) hours as needed for wheezing or shortness of breath. 04/02/20   Rodriguez-Southworth, Nettie Elm, PA-C  methylPREDNISolone (MEDROL DOSEPAK) 4 MG TBPK tablet Take as directed per pack 04/02/20   Rodriguez-Southworth, Nettie Elm, PA-C    Family History No family history on file.  Social History Social History   Tobacco Use   Smoking status: Current Every Day Smoker    Types: Cigarettes   Smokeless tobacco: Never Used  Substance Use Topics   Alcohol use: Not Currently   Drug use: Not Currently     Allergies   Patient has no known allergies.   Review of Systems Review of Systems  Constitutional: Positive for fatigue. Negative for chills, diaphoresis and fever.  HENT: Positive for congestion, postnasal drip and  rhinorrhea. Negative for sore throat and trouble swallowing.   Eyes: Negative for discharge.  Respiratory: Positive for cough, shortness of breath and wheezing. Negative for chest tightness.   Musculoskeletal: Negative for myalgias.  Skin: Negative for rash.  Neurological: Negative for headaches.     Physical Exam Triage Vital Signs ED Triage Vitals  Enc Vitals Group     BP 04/02/20 0944 116/82     Pulse Rate 04/02/20 0944 (!) 104     Resp 04/02/20 0944 20     Temp 04/02/20 0944 98.2 F (36.8 C)     Temp Source 04/02/20 0944 Oral     SpO2 04/02/20 0944 95 %     Weight --      Height --      Head Circumference --      Peak Flow --      Pain Score 04/02/20 0950 0     Pain Loc --      Pain Edu? --      Excl. in GC? --    No data found.  Updated Vital Signs BP 116/82 (BP Location: Left Arm)    Pulse (!) 104    Temp 98.2 F (36.8 C) (Oral)    Resp 20    SpO2 97%   Visual Acuity Right Eye Distance:   Left Eye Distance:   Bilateral Distance:    Right Eye Near:  Left Eye Near:    Bilateral Near:     Physical Exam Vitals and nursing note reviewed.  Constitutional:      Appearance: She is obese. She is not toxic-appearing or diaphoretic.     Comments: Has auditory wheezing  HENT:     Head: Normocephalic.  Cardiovascular:     Rate and Rhythm: Regular rhythm. Tachycardia present.     Heart sounds: No murmur heard.   Pulmonary:     Effort: Pulmonary effort is normal.     Breath sounds: Wheezing present.  Musculoskeletal:        General: Normal range of motion.     Cervical back: Normal range of motion.  Skin:    General: Skin is warm and dry.  Neurological:     Mental Status: She is alert and oriented to person, place, and time.  Psychiatric:        Mood and Affect: Mood normal.        Behavior: Behavior normal.      UC Treatments / Results  Labs (all labs ordered are listed, but only abnormal results are displayed) Labs Reviewed - No data to  display  EKG   Radiology DG Chest 2 View  Result Date: 04/02/2020 CLINICAL DATA:  Cough and shortness of breath EXAM: CHEST - 2 VIEW COMPARISON:  July 11, 2009 FINDINGS: Lungs are clear. Heart size and pulmonary vascularity are normal. No adenopathy. No bone lesions. IMPRESSION: Lungs clear.  Cardiac silhouette normal. Electronically Signed   By: Bretta Bang III M.D.   On: 04/02/2020 10:24    Procedures Procedures (including critical care time)  Medications Ordered in UC Medications  albuterol (VENTOLIN HFA) 108 (90 Base) MCG/ACT inhaler 2 puff (2 puffs Inhalation Given 04/02/20 1006)    Initial Impression / Assessment and Plan / UC Course  I have reviewed the triage vital signs and the nursing notes. URI with wheezing. She was given Albuterol inhaler HFA 2 puffs while here and before she headed to xray. 20 minutes later her pulse ox was 97 %, the wheezing was a little improved  Pertinent  imaging results that were available during my care of the patient were reviewed by me and considered in my medical decision making (see chart for details). I prescribed her Medrol dose pack and Albuterol inhaler. See instructions.   Final Clinical Impressions(s) / UC Diagnoses   Final diagnoses:  Viral URI  Wheezing     Discharge Instructions     Use the inhaler every 4 hour for 5-7 days as needed for cough and wheezing, but on a regular schedule for 3 days  You may use any over the counter cold meds if you need to.  Come back or see your PCP if you get worse or develop a fever    ED Prescriptions    Medication Sig Dispense Auth. Provider   albuterol (VENTOLIN HFA) 108 (90 Base) MCG/ACT inhaler Inhale 2 puffs into the lungs every 4 (four) hours as needed for wheezing or shortness of breath. 18 g Rodriguez-Southworth, Kahla Risdon, PA-C   methylPREDNISolone (MEDROL DOSEPAK) 4 MG TBPK tablet Take as directed per pack 21 tablet Rodriguez-Southworth, Nettie Elm, PA-C     PDMP not  reviewed this encounter.   Garey Ham, PA-C 04/02/20 1034

## 2020-04-02 NOTE — ED Triage Notes (Signed)
Pt c/o SOB with post nasal drip since Saturday. States having chest congestion and cough up foamy mucus. No distress noted. Speaking in complete sentences. Pt states concerns of PNA.

## 2020-08-06 DIAGNOSIS — F3175 Bipolar disorder, in partial remission, most recent episode depressed: Secondary | ICD-10-CM | POA: Diagnosis not present

## 2020-08-06 DIAGNOSIS — Z3009 Encounter for other general counseling and advice on contraception: Secondary | ICD-10-CM | POA: Diagnosis not present

## 2020-08-28 DIAGNOSIS — K8020 Calculus of gallbladder without cholecystitis without obstruction: Secondary | ICD-10-CM | POA: Diagnosis not present

## 2020-09-11 DIAGNOSIS — K8020 Calculus of gallbladder without cholecystitis without obstruction: Secondary | ICD-10-CM | POA: Diagnosis not present

## 2020-09-12 DIAGNOSIS — Z3046 Encounter for surveillance of implantable subdermal contraceptive: Secondary | ICD-10-CM | POA: Diagnosis not present

## 2020-09-12 DIAGNOSIS — Z1329 Encounter for screening for other suspected endocrine disorder: Secondary | ICD-10-CM | POA: Diagnosis not present

## 2020-09-12 DIAGNOSIS — Z79899 Other long term (current) drug therapy: Secondary | ICD-10-CM | POA: Diagnosis not present

## 2020-09-12 DIAGNOSIS — Z130 Encounter for screening for diseases of the blood and blood-forming organs and certain disorders involving the immune mechanism: Secondary | ICD-10-CM | POA: Diagnosis not present

## 2020-09-12 DIAGNOSIS — Z13220 Encounter for screening for lipoid disorders: Secondary | ICD-10-CM | POA: Diagnosis not present

## 2020-09-12 DIAGNOSIS — Z0000 Encounter for general adult medical examination without abnormal findings: Secondary | ICD-10-CM | POA: Diagnosis not present

## 2020-09-12 DIAGNOSIS — Z01419 Encounter for gynecological examination (general) (routine) without abnormal findings: Secondary | ICD-10-CM | POA: Diagnosis not present

## 2020-09-12 DIAGNOSIS — F3175 Bipolar disorder, in partial remission, most recent episode depressed: Secondary | ICD-10-CM | POA: Diagnosis not present

## 2020-09-12 DIAGNOSIS — Z13228 Encounter for screening for other metabolic disorders: Secondary | ICD-10-CM | POA: Diagnosis not present

## 2020-09-13 DIAGNOSIS — R12 Heartburn: Secondary | ICD-10-CM | POA: Insufficient documentation

## 2020-10-16 DIAGNOSIS — K8010 Calculus of gallbladder with chronic cholecystitis without obstruction: Secondary | ICD-10-CM | POA: Diagnosis not present

## 2020-10-16 DIAGNOSIS — F17210 Nicotine dependence, cigarettes, uncomplicated: Secondary | ICD-10-CM | POA: Diagnosis not present

## 2020-10-16 DIAGNOSIS — K8080 Other cholelithiasis without obstruction: Secondary | ICD-10-CM | POA: Diagnosis not present

## 2020-10-16 DIAGNOSIS — Z9049 Acquired absence of other specified parts of digestive tract: Secondary | ICD-10-CM | POA: Diagnosis not present

## 2020-10-16 DIAGNOSIS — Z6831 Body mass index (BMI) 31.0-31.9, adult: Secondary | ICD-10-CM | POA: Diagnosis not present

## 2020-10-16 DIAGNOSIS — J45909 Unspecified asthma, uncomplicated: Secondary | ICD-10-CM | POA: Diagnosis not present

## 2020-10-16 DIAGNOSIS — F319 Bipolar disorder, unspecified: Secondary | ICD-10-CM | POA: Diagnosis not present

## 2020-10-16 DIAGNOSIS — Z79899 Other long term (current) drug therapy: Secondary | ICD-10-CM | POA: Diagnosis not present

## 2020-10-16 DIAGNOSIS — E669 Obesity, unspecified: Secondary | ICD-10-CM | POA: Diagnosis not present

## 2021-08-27 DIAGNOSIS — F3112 Bipolar disorder, current episode manic without psychotic features, moderate: Secondary | ICD-10-CM | POA: Diagnosis not present

## 2021-08-29 DIAGNOSIS — F3112 Bipolar disorder, current episode manic without psychotic features, moderate: Secondary | ICD-10-CM | POA: Diagnosis not present

## 2021-09-27 DIAGNOSIS — Z0001 Encounter for general adult medical examination with abnormal findings: Secondary | ICD-10-CM | POA: Diagnosis not present

## 2021-09-27 DIAGNOSIS — F17200 Nicotine dependence, unspecified, uncomplicated: Secondary | ICD-10-CM | POA: Diagnosis not present

## 2021-09-27 DIAGNOSIS — Z1389 Encounter for screening for other disorder: Secondary | ICD-10-CM | POA: Diagnosis not present

## 2021-09-27 DIAGNOSIS — Z13228 Encounter for screening for other metabolic disorders: Secondary | ICD-10-CM | POA: Diagnosis not present

## 2021-09-27 DIAGNOSIS — F319 Bipolar disorder, unspecified: Secondary | ICD-10-CM | POA: Diagnosis not present

## 2021-09-27 DIAGNOSIS — Z79899 Other long term (current) drug therapy: Secondary | ICD-10-CM | POA: Diagnosis not present

## 2021-09-27 DIAGNOSIS — Z6839 Body mass index (BMI) 39.0-39.9, adult: Secondary | ICD-10-CM | POA: Diagnosis not present

## 2021-09-27 DIAGNOSIS — Z1331 Encounter for screening for depression: Secondary | ICD-10-CM | POA: Diagnosis not present

## 2021-09-27 DIAGNOSIS — F419 Anxiety disorder, unspecified: Secondary | ICD-10-CM | POA: Diagnosis not present

## 2021-10-21 DIAGNOSIS — F319 Bipolar disorder, unspecified: Secondary | ICD-10-CM | POA: Diagnosis not present

## 2021-10-21 DIAGNOSIS — F411 Generalized anxiety disorder: Secondary | ICD-10-CM | POA: Diagnosis not present

## 2021-10-21 DIAGNOSIS — Z0000 Encounter for general adult medical examination without abnormal findings: Secondary | ICD-10-CM | POA: Diagnosis not present

## 2021-10-21 DIAGNOSIS — N643 Galactorrhea not associated with childbirth: Secondary | ICD-10-CM | POA: Diagnosis not present

## 2021-11-07 DIAGNOSIS — R922 Inconclusive mammogram: Secondary | ICD-10-CM | POA: Diagnosis not present

## 2021-11-07 DIAGNOSIS — N643 Galactorrhea not associated with childbirth: Secondary | ICD-10-CM | POA: Diagnosis not present

## 2021-11-07 DIAGNOSIS — N6314 Unspecified lump in the right breast, lower inner quadrant: Secondary | ICD-10-CM | POA: Diagnosis not present

## 2021-11-07 DIAGNOSIS — N6489 Other specified disorders of breast: Secondary | ICD-10-CM | POA: Diagnosis not present

## 2021-11-20 DIAGNOSIS — F3112 Bipolar disorder, current episode manic without psychotic features, moderate: Secondary | ICD-10-CM | POA: Diagnosis not present

## 2022-01-30 DIAGNOSIS — K047 Periapical abscess without sinus: Secondary | ICD-10-CM | POA: Diagnosis not present

## 2022-02-04 DIAGNOSIS — R928 Other abnormal and inconclusive findings on diagnostic imaging of breast: Secondary | ICD-10-CM | POA: Diagnosis not present

## 2022-02-04 DIAGNOSIS — F411 Generalized anxiety disorder: Secondary | ICD-10-CM | POA: Diagnosis not present

## 2022-02-04 DIAGNOSIS — N643 Galactorrhea not associated with childbirth: Secondary | ICD-10-CM | POA: Diagnosis not present

## 2022-02-26 DIAGNOSIS — F3112 Bipolar disorder, current episode manic without psychotic features, moderate: Secondary | ICD-10-CM | POA: Diagnosis not present

## 2022-05-28 DIAGNOSIS — F3112 Bipolar disorder, current episode manic without psychotic features, moderate: Secondary | ICD-10-CM | POA: Diagnosis not present

## 2022-08-18 DIAGNOSIS — F3112 Bipolar disorder, current episode manic without psychotic features, moderate: Secondary | ICD-10-CM | POA: Diagnosis not present

## 2022-11-05 DIAGNOSIS — E559 Vitamin D deficiency, unspecified: Secondary | ICD-10-CM | POA: Diagnosis not present

## 2022-11-05 DIAGNOSIS — Z0000 Encounter for general adult medical examination without abnormal findings: Secondary | ICD-10-CM | POA: Diagnosis not present

## 2022-11-05 DIAGNOSIS — F319 Bipolar disorder, unspecified: Secondary | ICD-10-CM | POA: Diagnosis not present

## 2022-11-05 DIAGNOSIS — F411 Generalized anxiety disorder: Secondary | ICD-10-CM | POA: Diagnosis not present

## 2022-11-05 DIAGNOSIS — Z79899 Other long term (current) drug therapy: Secondary | ICD-10-CM | POA: Diagnosis not present

## 2022-11-12 DIAGNOSIS — F3112 Bipolar disorder, current episode manic without psychotic features, moderate: Secondary | ICD-10-CM | POA: Diagnosis not present

## 2022-12-10 DIAGNOSIS — F3112 Bipolar disorder, current episode manic without psychotic features, moderate: Secondary | ICD-10-CM | POA: Diagnosis not present

## 2023-02-11 DIAGNOSIS — F3112 Bipolar disorder, current episode manic without psychotic features, moderate: Secondary | ICD-10-CM | POA: Diagnosis not present

## 2023-06-11 DIAGNOSIS — M545 Low back pain, unspecified: Secondary | ICD-10-CM | POA: Diagnosis not present

## 2023-06-11 DIAGNOSIS — M542 Cervicalgia: Secondary | ICD-10-CM | POA: Diagnosis not present

## 2023-06-14 DIAGNOSIS — Z0389 Encounter for observation for other suspected diseases and conditions ruled out: Secondary | ICD-10-CM | POA: Diagnosis not present

## 2023-06-14 DIAGNOSIS — Z86718 Personal history of other venous thrombosis and embolism: Secondary | ICD-10-CM | POA: Diagnosis not present

## 2023-06-14 DIAGNOSIS — K5900 Constipation, unspecified: Secondary | ICD-10-CM | POA: Diagnosis not present

## 2023-06-14 DIAGNOSIS — Z041 Encounter for examination and observation following transport accident: Secondary | ICD-10-CM | POA: Diagnosis not present

## 2023-06-14 DIAGNOSIS — S8011XS Contusion of right lower leg, sequela: Secondary | ICD-10-CM | POA: Diagnosis not present

## 2023-06-14 DIAGNOSIS — S8011XA Contusion of right lower leg, initial encounter: Secondary | ICD-10-CM | POA: Diagnosis not present

## 2023-06-14 DIAGNOSIS — M79661 Pain in right lower leg: Secondary | ICD-10-CM | POA: Diagnosis not present

## 2023-06-14 DIAGNOSIS — R1084 Generalized abdominal pain: Secondary | ICD-10-CM | POA: Diagnosis not present

## 2023-06-15 DIAGNOSIS — Z87891 Personal history of nicotine dependence: Secondary | ICD-10-CM | POA: Diagnosis not present

## 2023-06-15 DIAGNOSIS — M79661 Pain in right lower leg: Secondary | ICD-10-CM | POA: Diagnosis not present

## 2023-06-15 DIAGNOSIS — Z86718 Personal history of other venous thrombosis and embolism: Secondary | ICD-10-CM | POA: Diagnosis not present

## 2023-06-15 DIAGNOSIS — S8011XS Contusion of right lower leg, sequela: Secondary | ICD-10-CM | POA: Diagnosis not present

## 2023-06-15 DIAGNOSIS — S8011XA Contusion of right lower leg, initial encounter: Secondary | ICD-10-CM | POA: Diagnosis not present

## 2023-06-15 DIAGNOSIS — Z0389 Encounter for observation for other suspected diseases and conditions ruled out: Secondary | ICD-10-CM | POA: Diagnosis not present

## 2023-06-23 DIAGNOSIS — S134XXS Sprain of ligaments of cervical spine, sequela: Secondary | ICD-10-CM | POA: Diagnosis not present

## 2023-06-23 DIAGNOSIS — M25552 Pain in left hip: Secondary | ICD-10-CM | POA: Diagnosis not present

## 2023-08-19 DIAGNOSIS — M25551 Pain in right hip: Secondary | ICD-10-CM | POA: Diagnosis not present

## 2023-08-19 DIAGNOSIS — J3089 Other allergic rhinitis: Secondary | ICD-10-CM | POA: Diagnosis not present

## 2023-08-25 DIAGNOSIS — R21 Rash and other nonspecific skin eruption: Secondary | ICD-10-CM | POA: Diagnosis not present

## 2023-08-25 DIAGNOSIS — F319 Bipolar disorder, unspecified: Secondary | ICD-10-CM | POA: Diagnosis not present

## 2023-08-25 DIAGNOSIS — B029 Zoster without complications: Secondary | ICD-10-CM | POA: Diagnosis not present

## 2023-09-10 DIAGNOSIS — M7711 Lateral epicondylitis, right elbow: Secondary | ICD-10-CM | POA: Diagnosis not present

## 2023-09-10 DIAGNOSIS — S92919A Unspecified fracture of unspecified toe(s), initial encounter for closed fracture: Secondary | ICD-10-CM | POA: Diagnosis not present

## 2023-09-10 DIAGNOSIS — M79671 Pain in right foot: Secondary | ICD-10-CM | POA: Diagnosis not present

## 2023-11-11 DIAGNOSIS — E559 Vitamin D deficiency, unspecified: Secondary | ICD-10-CM | POA: Diagnosis not present

## 2023-11-11 DIAGNOSIS — Z83438 Family history of other disorder of lipoprotein metabolism and other lipidemia: Secondary | ICD-10-CM | POA: Diagnosis not present

## 2023-11-11 DIAGNOSIS — Z0000 Encounter for general adult medical examination without abnormal findings: Secondary | ICD-10-CM | POA: Diagnosis not present

## 2023-11-11 DIAGNOSIS — R294 Clicking hip: Secondary | ICD-10-CM | POA: Diagnosis not present

## 2023-11-11 DIAGNOSIS — F319 Bipolar disorder, unspecified: Secondary | ICD-10-CM | POA: Diagnosis not present

## 2023-11-11 DIAGNOSIS — Z79899 Other long term (current) drug therapy: Secondary | ICD-10-CM | POA: Diagnosis not present

## 2023-11-18 DIAGNOSIS — F3112 Bipolar disorder, current episode manic without psychotic features, moderate: Secondary | ICD-10-CM | POA: Diagnosis not present

## 2023-11-24 DIAGNOSIS — D72819 Decreased white blood cell count, unspecified: Secondary | ICD-10-CM | POA: Diagnosis not present

## 2023-11-24 DIAGNOSIS — F411 Generalized anxiety disorder: Secondary | ICD-10-CM | POA: Diagnosis not present

## 2023-12-08 DIAGNOSIS — M25552 Pain in left hip: Secondary | ICD-10-CM | POA: Diagnosis not present

## 2023-12-08 DIAGNOSIS — M6281 Muscle weakness (generalized): Secondary | ICD-10-CM | POA: Diagnosis not present

## 2023-12-15 DIAGNOSIS — M6281 Muscle weakness (generalized): Secondary | ICD-10-CM | POA: Diagnosis not present

## 2023-12-15 DIAGNOSIS — M25552 Pain in left hip: Secondary | ICD-10-CM | POA: Diagnosis not present

## 2023-12-22 DIAGNOSIS — M6281 Muscle weakness (generalized): Secondary | ICD-10-CM | POA: Diagnosis not present

## 2023-12-22 DIAGNOSIS — M25552 Pain in left hip: Secondary | ICD-10-CM | POA: Diagnosis not present

## 2023-12-29 DIAGNOSIS — M6281 Muscle weakness (generalized): Secondary | ICD-10-CM | POA: Diagnosis not present

## 2023-12-29 DIAGNOSIS — M25552 Pain in left hip: Secondary | ICD-10-CM | POA: Diagnosis not present

## 2024-01-05 DIAGNOSIS — M25552 Pain in left hip: Secondary | ICD-10-CM | POA: Diagnosis not present

## 2024-01-05 DIAGNOSIS — M6281 Muscle weakness (generalized): Secondary | ICD-10-CM | POA: Diagnosis not present

## 2024-01-12 DIAGNOSIS — M25552 Pain in left hip: Secondary | ICD-10-CM | POA: Diagnosis not present

## 2024-01-12 DIAGNOSIS — M6281 Muscle weakness (generalized): Secondary | ICD-10-CM | POA: Diagnosis not present

## 2024-01-18 DIAGNOSIS — M25552 Pain in left hip: Secondary | ICD-10-CM | POA: Diagnosis not present

## 2024-01-18 DIAGNOSIS — M6281 Muscle weakness (generalized): Secondary | ICD-10-CM | POA: Diagnosis not present

## 2024-01-26 DIAGNOSIS — M25552 Pain in left hip: Secondary | ICD-10-CM | POA: Diagnosis not present

## 2024-01-26 DIAGNOSIS — M6281 Muscle weakness (generalized): Secondary | ICD-10-CM | POA: Diagnosis not present

## 2024-02-23 DIAGNOSIS — F3112 Bipolar disorder, current episode manic without psychotic features, moderate: Secondary | ICD-10-CM | POA: Diagnosis not present

## 2024-03-15 DIAGNOSIS — M6281 Muscle weakness (generalized): Secondary | ICD-10-CM | POA: Diagnosis not present

## 2024-03-15 DIAGNOSIS — M25552 Pain in left hip: Secondary | ICD-10-CM | POA: Diagnosis not present

## 2024-03-22 DIAGNOSIS — M6281 Muscle weakness (generalized): Secondary | ICD-10-CM | POA: Diagnosis not present

## 2024-03-22 DIAGNOSIS — M25552 Pain in left hip: Secondary | ICD-10-CM | POA: Diagnosis not present

## 2024-03-29 DIAGNOSIS — M6281 Muscle weakness (generalized): Secondary | ICD-10-CM | POA: Diagnosis not present

## 2024-03-29 DIAGNOSIS — M25552 Pain in left hip: Secondary | ICD-10-CM | POA: Diagnosis not present

## 2024-04-15 ENCOUNTER — Ambulatory Visit: Admission: EM | Admit: 2024-04-15 | Discharge: 2024-04-15 | Disposition: A | Discharge status: Home / Self Care

## 2024-04-15 ENCOUNTER — Encounter: Payer: Self-pay | Admitting: Emergency Medicine

## 2024-04-15 DIAGNOSIS — H6991 Unspecified Eustachian tube disorder, right ear: Secondary | ICD-10-CM | POA: Diagnosis not present

## 2024-04-15 MED ORDER — PREDNISONE 50 MG PO TABS: ORAL_TABLET | ORAL | 0 refills | Status: DC

## 2024-04-15 MED ORDER — PSEUDOEPHEDRINE HCL 30 MG PO TABS: 30.0000 mg | ORAL_TABLET | ORAL | 0 refills | Status: AC | PRN

## 2024-04-15 MED ORDER — FLUTICASONE PROPIONATE 50 MCG/ACT NA SUSP: 1.0000 | Freq: Every day | NASAL | 0 refills | Status: AC

## 2024-04-15 NOTE — ED Triage Notes (Signed)
 Pt presents c/o ear pain x 1 day. Pt states,  I noticed today I have had really sharp pains in my right ear. It's a little tender around the bottom of the ear near my jaw line.  Pt denies throat pain.   Pt also requesting referral for GI do to pelvic pain and constipation x 3 weeks.   Pt denies emesis and diarrhea.

## 2024-04-15 NOTE — ED Provider Notes (Signed)
 [facility]    CSN: [id number] Arrival date & time: 04/15/24  1441      History   Chief Complaint No chief complaint on file.   HPI Cheryl Hood is a 33 y.o. female.   Presents today due to 1 day worth of sharp pain in right ear.  Patient also states that she is experiencing nasal congestion and purulent nasal drainage.  Patient denies use of anything for symptoms, loss of hearing, or otorrhea.  The history is provided by the patient.    Past Medical History:  Diagnosis Date   Asthma    Bipolar 1 disorder [facility])     Patient Active Problem List   Diagnosis Date Noted   Heartburn 09/13/2020   Calculus of gallbladder without cholecystitis without obstruction 10/05/2019   Bipolar affective (HCC) 09/01/2013   Syncopal episodes 09/01/2013    History reviewed. No pertinent surgical history.  OB History   No obstetric history on file.      Home Medications    Prior to Admission medications   Medication Sig Start Date End Date Taking? Authorizing Provider  fluticasone  (FLONASE ) 50 MCG/ACT nasal spray Place 1 spray into both nostrils daily. 04/15/24  Yes Andra Corean JAYSON, PA-C  ibuprofen (ADVIL) 600 MG tablet Take 600 mg by mouth. 02/27/14  Yes [provider]  pantoprazole (PROTONIX) 40 MG tablet Take 40 mg by mouth. 09/12/20  Yes [provider]  predniSONE  (DELTASONE ) 50 MG tablet Take 1 tab po daily for 5 days 04/15/24  Yes Andra Corean JAYSON, PA-C  pseudoephedrine  (SUDAFED) 30 MG tablet Take 1 tablet (30 mg total) by mouth every 4 (four) hours as needed for congestion. 04/15/24  Yes Andra Corean JAYSON, PA-C  acetaminophen (TYLENOL) 500 MG tablet Take 1,000 mg by mouth.    [provider]  albuterol  (VENTOLIN  HFA) 108 (90 Base) MCG/ACT inhaler Inhale 2 puffs into the lungs every 4 (four) hours as needed for wheezing or shortness of breath. 04/02/20   Rodriguez-Southworth, Sylvia, PA-C  cyclobenzaprine (FLEXERIL) 10  MG tablet Take 10 mg by mouth.    [provider]  etonogestrel  (NEXPLANON ) 68 MG IMPL implant Inject into the skin.    [provider]  iloperidone (FANAPT) 4 MG TABS tablet Take 4 mg by mouth 2 (two) times daily.    [provider]  naproxen (NAPROSYN) 500 MG tablet Take 500 mg by mouth.    [provider]  Omega-3 Fatty Acids (FISH OIL) 1200 MG CPDR Take 1 capsule by mouth daily.    [provider]  valACYclovir (VALTREX) 1000 MG tablet Take 1,000 mg by mouth.    [provider]    Family History History reviewed. No pertinent family history.  Social History Social History   Tobacco Use   Smoking status: Every Day    Types: Cigarettes    Passive exposure: Current   Smokeless tobacco: Never  Vaping Use   Vaping status: Never Used  Substance Use Topics   Alcohol  use: Not Currently   Drug use: Not Currently     Allergies   Dust mite extract and Shrimp extract   Review of Systems Review of Systems   Physical Exam Triage Vital Signs ED Triage Vitals  Encounter Vitals Group     BP 04/15/24 1539 114/72     Girls Systolic BP Percentile --      Girls Diastolic BP Percentile --      Boys Systolic BP Percentile --  Boys Diastolic BP Percentile --      Pulse Rate 04/15/24 1539 69     Resp 04/15/24 1539 18     Temp 04/15/24 1539 98.9 F (37.2 C)     Temp Source 04/15/24 1539 Oral     SpO2 04/15/24 1539 98 %     Weight 04/15/24 1538 201 lb (91.2 kg)     Height --      Head Circumference --      Peak Flow --      Pain Score 04/15/24 1537 6     Pain Loc --      Pain Education --      Exclude from Growth Chart --    No data found.  Updated Vital Signs BP 114/72 (BP Location: Right Arm)   Pulse 69   Temp 98.9 F (37.2 C) (Oral)   Resp 18   Wt 201 lb (91.2 kg)   LMP 04/02/2024 (Exact Date)   SpO2 98%   Visual Acuity Right Eye Distance:   Left Eye Distance:   Bilateral Distance:    Right Eye Near:    Left Eye Near:    Bilateral Near:     Physical Exam Vitals and nursing note reviewed.  Constitutional:      General: She is not in acute distress.    Appearance: Normal appearance. She is not ill-appearing, toxic-appearing or diaphoretic.  HENT:     Right Ear: A middle ear effusion (serous fluid, bubbles present) is present.     Left Ear: Tympanic membrane, ear canal and external ear normal.     Nose: Congestion (moderately enlarged turbinates) present. No rhinorrhea.     Mouth/Throat:     Mouth: Mucous membranes are moist.     Pharynx: Oropharynx is clear. No oropharyngeal exudate or posterior oropharyngeal erythema.  Eyes:     General: No scleral icterus. Cardiovascular:     Rate and Rhythm: Normal rate and regular rhythm.     Heart sounds: Normal heart sounds.  Pulmonary:     Effort: Pulmonary effort is normal. No respiratory distress.     Breath sounds: Normal breath sounds. No wheezing or rhonchi.  Skin:    General: Skin is warm.  Neurological:     Mental Status: She is alert and oriented to person, place, and time.  Psychiatric:        Mood and Affect: Mood normal.        Behavior: Behavior normal.      UC Treatments / Results  Labs (all labs ordered are listed, but only abnormal results are displayed) Labs Reviewed - No data to display  EKG   Radiology No results found.  Procedures Procedures (including critical care time)  Medications Ordered in UC Medications - No data to display  Initial Impression / Assessment and Plan / UC Course  I have reviewed the triage vital signs and the nursing notes.  Pertinent labs & imaging results that were available during my care of the patient were reviewed by me and considered in my medical decision making (see chart for details).    Final Clinical Impressions(s) / UC Diagnoses   Final diagnoses:  Dysfunction of right eustachian tube   Discharge Instructions   None    ED Prescriptions     Medication Sig  Dispense Auth. Provider   fluticasone (FLONASE) 50 MCG/ACT nasal spray Place 1 spray into both nostrils daily. 16 g Phuong Moffatt C, PA-C   pseudoephedrine (SUDAFED) 30 MG tablet Take  1 tablet (30 mg total) by mouth every 4 (four) hours as needed for congestion. 30 tablet Andra Krabbe C, PA-C   predniSONE (DELTASONE) 50 MG tablet Take 1 tab po daily for 5 days 5 tablet Andra Krabbe BROCKS, PA-C      PDMP not reviewed this encounter.   Andra Krabbe BROCKS, PA-C 04/15/24 1554

## 2024-04-27 ENCOUNTER — Emergency Department (HOSPITAL_BASED_OUTPATIENT_CLINIC_OR_DEPARTMENT_OTHER)
Admission: EM | Admit: 2024-04-27 | Discharge: 2024-04-27 | Disposition: A | Discharge status: Home / Self Care | Attending: Emergency Medicine | Admitting: Emergency Medicine

## 2024-04-27 DIAGNOSIS — M25552 Pain in left hip: Secondary | ICD-10-CM | POA: Diagnosis not present

## 2024-04-27 DIAGNOSIS — K625 Hemorrhage of anus and rectum: Secondary | ICD-10-CM | POA: Insufficient documentation

## 2024-04-27 DIAGNOSIS — F319 Bipolar disorder, unspecified: Secondary | ICD-10-CM | POA: Diagnosis not present

## 2024-04-27 DIAGNOSIS — Z87898 Personal history of other specified conditions: Secondary | ICD-10-CM | POA: Diagnosis not present

## 2024-04-27 LAB — COMPREHENSIVE METABOLIC PANEL WITH GFR
ALT: 27 U/L (ref 0–44)
AST: 17 U/L (ref 15–41)
Albumin: 4.2 g/dL (ref 3.5–5.0)
Alkaline Phosphatase: 79 U/L (ref 38–126)
Anion gap: 14 (ref 5–15)
BUN: 15 mg/dL (ref 6–20)
CO2: 22 mmol/L (ref 22–32)
Calcium: 9.4 mg/dL (ref 8.9–10.3)
Chloride: 103 mmol/L (ref 98–111)
Creatinine, Ser: 0.66 mg/dL (ref 0.44–1.00)
GFR, Estimated: >60 mL/min (ref >60)
Glucose, Bld: 109 mg/dL — ABNORMAL HIGH (ref 70–99)
Potassium: 3.8 mmol/L (ref 3.5–5.1)
Sodium: 139 mmol/L (ref 135–145)
Total Bilirubin: 0.3 mg/dL (ref 0.0–1.2)
Total Protein: 6.8 g/dL (ref 6.5–8.1)

## 2024-04-27 LAB — CBC
HCT: 39.7 % (ref 36.0–46.0)
Hemoglobin: 13.7 g/dL (ref 12.0–15.0)
MCH: 29.8 pg (ref 26.0–34.0)
MCHC: 34.5 g/dL (ref 30.0–36.0)
MCV: 86.5 fL (ref 80.0–100.0)
Platelets: 233 K/uL (ref 150–400)
RBC: 4.59 MIL/uL (ref 3.87–5.11)
RDW: 13.1 % (ref 11.5–15.5)
WBC: 11 K/uL — ABNORMAL HIGH (ref 4.0–10.5)
nRBC: 0 % (ref 0.0–0.2)

## 2024-04-27 LAB — PREGNANCY, URINE: Preg Test, Ur: NEGATIVE

## 2024-04-27 MED ORDER — PREDNISONE 20 MG PO TABS
40.0000 mg | ORAL_TABLET | Freq: Once | ORAL | Status: AC
  Administered 2024-04-27: 40 mg via ORAL
  Filled 2024-04-27: qty 2

## 2024-04-27 MED ORDER — PREDNISONE 10 MG PO TABS: 20.0000 mg | ORAL_TABLET | Freq: Two times a day (BID) | ORAL | 0 refills | Status: AC

## 2024-04-27 NOTE — ED Notes (Signed)
 Assisted MD Delo with pt rectal exam. Pt experienced some pain with turning onto side, related to existing left hip pain, comfortable post procedure.
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
## 2024-04-27 NOTE — ED Notes (Signed)
Patient verbalizes understanding of discharge instructions. Opportunity for questioning and answers were provided. Armband removed by staff, pt discharged from ED. Ambulated out to lobby  

## 2024-04-27 NOTE — Discharge Instructions (Signed)
 Begin taking prednisone  as prescribed.  Begin taking Metamucil 1 heaping teaspoon in a glass of water 3 times daily along with Colace (equate stool softener) 100 mg twice daily.  These medications are available over-the-counter and should soften your stool and hopefully prevent further bleeding.  Follow-up with your primary doctor if symptoms are not improving in the next 1 to 2 weeks.

## 2024-04-27 NOTE — ED Provider Notes (Signed)
 [place] EMER[facility]ENC[facility] DEPARTMENT AT [facility] Provider Note   CSN: [id number] Arrival date & time: 04/27/24  0119     Patient presents with: No chief complaint on file.   Cheryl Hood is a 33 y.o. female.   Patient is a 33 year old female presenting with complaints of rectal bleeding and left hip pain.  The rectal bleeding has been ongoing for several weeks.  She was constipated and took laxatives several weeks ago in order to have a bowel movement.  She had a large bowel movement and has been having bleeding from her rectum since.  This occurs when she stools.  She denies any abdominal pain.  She also complains of pain in her left hip that is worse when she attempts to ambulate.  This began in the absence of any injury or trauma.       Prior to Admission medications  Medication Sig Start Date End Date Taking? Authorizing Provider  acetaminophen (TYLENOL) 500 MG tablet Take 1,000 mg by mouth.    [provider]  albuterol  (VENTOLIN  HFA) 108 (90 Base) MCG/ACT inhaler Inhale 2 puffs into the lungs every 4 (four) hours as needed for wheezing or shortness of breath. 04/02/20   Rodriguez-Southworth, Sylvia, PA-C  cyclobenzaprine (FLEXERIL) 10 MG tablet Take 10 mg by mouth.    [provider]  etonogestrel  (NEXPLANON ) 68 MG IMPL implant Inject into the skin.    [provider]  fluticasone  (FLONASE ) 50 MCG/ACT nasal spray Place 1 spray into both nostrils daily. 04/15/24   Andra Corean JAYSON, PA-C  ibuprofen (ADVIL) 600 MG tablet Take 600 mg by mouth. 02/27/14   [provider]  iloperidone (FANAPT) 4 MG TABS tablet Take 4 mg by mouth 2 (two) times daily.    [provider]  naproxen (NAPROSYN) 500 MG tablet Take 500 mg by mouth.    [provider]  Omega-3 Fatty Acids (FISH OIL) 1200 MG CPDR Take 1 capsule by mouth daily.    [provider]  pantoprazole (PROTONIX) 40 MG tablet Take 40 mg by mouth. 09/12/20    [provider]  predniSONE  (DELTASONE ) 50 MG tablet Take 1 tab po daily for 5 days 04/15/24   Andra Corean JAYSON, PA-C  pseudoephedrine  (SUDAFED) 30 MG tablet Take 1 tablet (30 mg total) by mouth every 4 (four) hours as needed for congestion. 04/15/24   Andra Corean JAYSON, PA-C  valACYclovir (VALTREX) 1000 MG tablet Take 1,000 mg by mouth.    [provider]    Allergies: Dust mite extract and Shrimp extract    Review of Systems  All other systems reviewed and are negative.   Updated Vital Signs BP 124/81   Pulse 91   Temp 98.5 F (36.9 C)   Resp 16   Ht 5' 5 (1.651 m)   Wt 98.9 kg   LMP 04/02/2024 (Exact Date)   SpO2 100%   BMI 36.28 kg/m   Physical Exam Vitals and nursing note reviewed.  Constitutional:      Appearance: Normal appearance.  HENT:     Head: Normocephalic.  Pulmonary:     Effort: Pulmonary effort is normal.  Genitourinary:    Rectum: Normal.     Comments: Inspection of the rectum is unremarkable.  There are no fissures noted.  No external hemorrhoids.  No internal exam performed.  Exam was witnessed by RN Grayce Mace. Musculoskeletal:     Comments: The left hip appears grossly normal.  There is pain with range  of motion.  The left leg is neurovascularly intact.  Skin:    General: Skin is warm and dry.  Neurological:     Mental Status: She is alert and oriented to person, place, and time.     (all labs ordered are listed, but only abnormal results are displayed) Labs Reviewed  COMPREHENSIVE METABOLIC PANEL WITH GFR - Abnormal; Notable for the following components:      Result Value   Glucose, Bld 109 (*)    All other components within normal limits  CBC - Abnormal; Notable for the following components:   WBC 11.0 (*)    All other components within normal limits  PREGNANCY, URINE  POC OCCULT BLOOD, ED    EKG: None  Radiology: No results found.   Procedures   Medications Ordered in the ED  predniSONE   (DELTASONE ) tablet 40 mg (has no administration in time range)                                    Medical Decision Making Amount and/or Complexity of Data Reviewed Labs: ordered.  Risk Prescription drug management.   Patient presenting with rectal bleeding as described in the HPI.  Patient arrives here with stable vital signs and is afebrile.  Abdomen is benign.  Rectal examination unremarkable.  Laboratory studies obtained including CBC and basic metabolic panel, both of which are unremarkable.  Her hemoglobin is 13.7 and white count 11,000.  Patient's symptoms most likely related to a fissure or possibly an internal hemorrhoid.  My recommendations for her will be stool softeners and fiber supplementation.  To be discharged.  She also complains of pain in the left hip which I believe is a separate issue.  I have agreed to prescribe a dose of prednisone  for what I believed to be an inflamed hip flexor.     Final diagnoses:  None    ED Discharge Orders     None          Geroldine Berg, MD 04/27/24 [phone]
